# Patient Record
Sex: Female | Born: 1970 | Race: White | Hispanic: No | Marital: Married | Smoking: Never smoker
Health system: Southern US, Community
[De-identification: ages and names within clinical notes are randomized; demographics above are authoritative.]

---

## 2015-03-04 ENCOUNTER — Encounter (HOSPITAL_BASED_OUTPATIENT_CLINIC_OR_DEPARTMENT_OTHER): Payer: Self-pay | Admitting: Emergency Medicine

## 2015-03-04 ENCOUNTER — Emergency Department (HOSPITAL_BASED_OUTPATIENT_CLINIC_OR_DEPARTMENT_OTHER)
Admission: EM | Admit: 2015-03-04 | Discharge: 2015-03-04 | Disposition: A | Discharge status: Home / Self Care | Payer: Federal, State, Local not specified - PPO | Attending: Emergency Medicine | Admitting: Emergency Medicine

## 2015-03-04 ENCOUNTER — Emergency Department (HOSPITAL_BASED_OUTPATIENT_CLINIC_OR_DEPARTMENT_OTHER): Payer: Federal, State, Local not specified - PPO

## 2015-03-04 DIAGNOSIS — R002 Palpitations: Secondary | ICD-10-CM

## 2015-03-04 DIAGNOSIS — R55 Syncope and collapse: Secondary | ICD-10-CM | POA: Diagnosis not present

## 2015-03-04 DIAGNOSIS — Z79899 Other long term (current) drug therapy: Secondary | ICD-10-CM | POA: Diagnosis not present

## 2015-03-04 DIAGNOSIS — R079 Chest pain, unspecified: Secondary | ICD-10-CM | POA: Diagnosis present

## 2015-03-04 LAB — BASIC METABOLIC PANEL
ANION GAP: 11 (ref 5–15)
BUN: 16 mg/dL (ref 6–20)
CO2: 25 mmol/L (ref 22–32)
Calcium: 9.1 mg/dL (ref 8.9–10.3)
Chloride: 105 mmol/L (ref 101–111)
Creatinine, Ser: 0.85 mg/dL (ref 0.44–1.00)
GFR calc Af Amer: >60 mL/min (ref >60)
GFR calc non Af Amer: >60 mL/min (ref >60)
GLUCOSE: 96 mg/dL (ref 65–99)
POTASSIUM: 3.3 mmol/L — AB (ref 3.5–5.1)
Sodium: 141 mmol/L (ref 135–145)

## 2015-03-04 LAB — CBC
HEMATOCRIT: 40.9 % (ref 36.0–46.0)
HEMOGLOBIN: 14.5 g/dL (ref 12.0–15.0)
MCH: 33 pg (ref 26.0–34.0)
MCHC: 35.5 g/dL (ref 30.0–36.0)
MCV: 93 fL (ref 78.0–100.0)
Platelets: 153 10*3/uL (ref 150–400)
RBC: 4.4 MIL/uL (ref 3.87–5.11)
RDW: 12 % (ref 11.5–15.5)
WBC: 7.2 10*3/uL (ref 4.0–10.5)

## 2015-03-04 LAB — TROPONIN I: Troponin I: <0.03 ng/mL (ref <0.031)

## 2015-03-04 MED ORDER — SODIUM CHLORIDE 0.9 % IV BOLUS (SEPSIS): 1000.0000 mL | Freq: Once | INTRAVENOUS | Status: DC

## 2015-03-04 MED ORDER — SODIUM CHLORIDE 0.9 % IV BOLUS (SEPSIS)
1000.0000 mL | Freq: Once | INTRAVENOUS | Status: AC
  Administered 2015-03-04: 1000 mL via INTRAVENOUS

## 2015-03-04 NOTE — ED Provider Notes (Signed)
CSN: [id number]     Arrival date & time 03/04/15  1610 History   First MD Initiated Contact with Patient 03/04/15 1632     Chief Complaint  Patient presents with  . Chest Pain     (Consider location/radiation/quality/duration/timing/severity/associated sxs/prior Treatment) HPI   Blood pressure 148/79, pulse 99, temperature 98.3 F (36.8 C), temperature source Oral, resp. rate 18, height  (1.676 m), weight 142 lb (64.411 kg), last menstrual period 03/04/2015, SpO2 100 %.  Barbara Pearson is a 44 y.o. female complaining of fluttering sensation in the left side of the chest onset 1 month ago she has these episodes several times per week, she feels lightheaded as well. This isn't necessarily tied to exertion or standing. Patient denies history of DVT or PE, family history of early cardiac death, family history of ACS, tobacco use, diabetes, hypertension, hyperlipidemia, recent trips or mobilizations, Esther June use, calf pain or leg swelling. As per her husband she drinks approximately 6 ounces of water per day. She denies nausea, vomiting, diarrhea.   History reviewed. No pertinent past medical history. History reviewed. No pertinent past surgical history. History reviewed. No pertinent family history. Social History  Substance Use Topics  . Smoking status: Never Smoker   . Smokeless tobacco: None  . Alcohol Use: Yes     Comment: occassional   OB History    No data available     Review of Systems  10 systems reviewed and found to be negative, except as noted in the HPI.  Allergies  Codeine  Home Medications   Prior to Admission medications   Medication Sig Start Date End Date Taking? Authorizing Provider  fexofenadine (ALLEGRA) 30 MG tablet Take 30 mg by mouth 2 (two) times daily.   Yes Historical Provider, MD   BP 125/84 mmHg  Pulse 86  Temp(Src) 98.3 F (36.8 C) (Oral)  Resp 15  Ht  (1.676 m)  Wt 142 lb (64.411 kg)  BMI 22.93 kg/m2  SpO2 100%  LMP  03/04/2015 (LMP Unknown) Physical Exam  Constitutional: She appears well-developed and well-nourished.  HENT:  Head: Normocephalic.  Eyes: Conjunctivae are normal.  Neck: Normal range of motion.  Cardiovascular: Normal rate, regular rhythm and intact distal pulses.   Pulmonary/Chest: Effort normal.  Abdominal: Soft. There is no tenderness.  Neurological: She is alert.  No point tenderness to percussion of lumbar spinal processes.  No TTP or paraspinal muscular spasm. Strength is 5 out of 5 to bilateral lower extremities at hip and knee; extensor hallucis longus 5 out of 5. Ankle strength 5 out of 5, no clonus, neurovascularly intact. No saddle anaesthesia. Patellar reflexes are 2+ bilaterally.       Psychiatric: She has a normal mood and affect.  Nursing note and vitals reviewed.   ED Course  Procedures (including critical care time) Labs Review Labs Reviewed  BASIC METABOLIC PANEL - Abnormal; Notable for the following:    Potassium 3.3 (*)    All other components within normal limits  CBC  TROPONIN I    Imaging Review Dg Chest 2 View  03/04/2015   CLINICAL DATA:  Intermittent chest pain for 3 weeks.  EXAM: CHEST  2 VIEW  COMPARISON:  None.  FINDINGS: Normal cardiac and mediastinal contours. Monitoring leads overlie the patient. No consolidative pulmonary opacities. No pleural effusion or pneumothorax. Mid thoracic spine degenerative changes.  IMPRESSION: No active cardiopulmonary disease.   Electronically Signed   By: Annia Belt M.D.   On: 03/04/2015  16:59   I have personally reviewed and evaluated these images and lab results as part of my medical decision-making.   EKG Interpretation None      MDM   Final diagnoses:  Palpitations  Pre-syncope    Filed Vitals:   03/04/15 1648 03/04/15 1700 03/04/15 1727 03/04/15 1728  BP: 131/77 126/69 136/81 125/84  Pulse: 77 74 74 86  Temp:      TempSrc:      Resp: 16 13 18 15   Height:      Weight:      SpO2: 96% 99% 100%  100%    Medications  sodium chloride 0.9 % bolus 1,000 mL (not administered)  sodium chloride 0.9 % bolus 1,000 mL (1,000 mLs Intravenous New Bag/Given 03/04/15 1730)    Barbara Pearson is a pleasant 44 y.o. female presenting with chest fluttering and lightheaded sensation intermittently over the course of the last 4 weeks. Doesn't really describe it as pain. Patient has normal vital signs, her EKG is without arrhythmia or ischemia, chest x-ray and blood work are reassuring. This may be dehydration versus intermittent arrhythmia, I've advised her that she may need to see cardiology for Holter monitoring. Will check orthostatics and give a second bolus.  Vital signs are not consistent with orthostasis. Cardiology referral given and also encouraged her to her primary care Dr. Morrie Sheldon at no vomiting.  Evaluation does not show pathology that would require ongoing emergent intervention or inpatient treatment. Pt is hemodynamically stable and mentating appropriately. Discussed findings and plan with patient/guardian, who agrees with care plan. All questions answered. Return precautions discussed and outpatient follow up given.       Wynetta Emery, PA-C 03/04/15 1757  Geoffery Lyons, MD 03/04/15 2248

## 2015-03-04 NOTE — ED Notes (Signed)
Patient reports chest pain which began about 3 weeks ago. Reports worsening pain today. Reports the pain as constant currently but states this was intermittent in the past.

## 2015-03-04 NOTE — Discharge Instructions (Signed)
Please follow with your primary care doctor in the next 2 days for a check-up. They must obtain records for further management.   Do not hesitate to return to the Emergency Department for any new, worsening or concerning symptoms.    Palpitations A palpitation is the feeling that your heartbeat is irregular or is faster than normal. It may feel like your heart is fluttering or skipping a beat. Palpitations are usually not a serious problem. However, in some cases, you may need further medical evaluation. CAUSES  Palpitations can be caused by:  Smoking.  Caffeine or other stimulants, such as diet pills or energy drinks.  Alcohol.  Stress and anxiety.  Strenuous physical activity.  Fatigue.  Certain medicines.  Heart disease, especially if you have a history of irregular heart rhythms (arrhythmias), such as atrial fibrillation, atrial flutter, or supraventricular tachycardia.  An improperly working pacemaker or defibrillator. DIAGNOSIS  To find the cause of your palpitations, your health care provider will take your medical history and perform a physical exam. Your health care provider may also have you take a test called an ambulatory electrocardiogram (ECG). An ECG records your heartbeat patterns over a 24-hour period. You may also have other tests, such as:  Transthoracic echocardiogram (TTE). During echocardiography, sound waves are used to evaluate how blood flows through your heart.  Transesophageal echocardiogram (TEE).  Cardiac monitoring. This allows your health care provider to monitor your heart rate and rhythm in real time.  Holter monitor. This is a portable device that records your heartbeat and can help diagnose heart arrhythmias. It allows your health care provider to track your heart activity for several days, if needed.  Stress tests by exercise or by giving medicine that makes the heart beat faster. TREATMENT  Treatment of palpitations depends on the cause of  your symptoms and can vary greatly. Most cases of palpitations do not require any treatment other than time, relaxation, and monitoring your symptoms. Other causes, such as atrial fibrillation, atrial flutter, or supraventricular tachycardia, usually require further treatment. HOME CARE INSTRUCTIONS   Avoid:  Caffeinated coffee, tea, soft drinks, diet pills, and energy drinks.  Chocolate.  Alcohol.  Stop smoking if you smoke.  Reduce your stress and anxiety. Things that can help you relax include:  A method of controlling things in your body, such as your heartbeats, with your mind (biofeedback).  Yoga.  Meditation.  Physical activity such as swimming, jogging, or walking.  Get plenty of rest and sleep. SEEK MEDICAL CARE IF:   You continue to have a fast or irregular heartbeat beyond 24 hours.  Your palpitations occur more often. SEEK IMMEDIATE MEDICAL CARE IF:  You have chest pain or shortness of breath.  You have a severe headache.  You feel dizzy or you faint. MAKE SURE YOU:  Understand these instructions.  Will watch your condition.  Will get help right away if you are not doing well or get worse. Document Released: 06/27/2000 Document Revised: 07/05/2013 Document Reviewed: 08/29/2011 [facility] Patient Information 2015 [place], [place]. This information is not intended to replace advice given to you by your health care provider. Make sure you discuss any questions you have with your health care provider.  Holter Monitoring A Holter monitor is a small device with electrodes (small sticky patches) that attach to your chest. It records the electrical activity of your heart and is worn continuously for 24-48 hours.  A HOLTER MONITOR IS USED TO  Detect heart problems such as:  Heart arrhythmia.  Is an abnormal or irregular heartbeat. With some heart arrhythmias, you may not feel or know that you have an irregular heart rhythm.  Palpitations, such as feeling your  heart racing or fluttering. It is possible to have heart palpitations and not have a heart arrhythmia.  A heart rhythm that is too slow or too fast.  If you have problems fainting, near fainting or feeling light-headed, a Holter monitor may be worn to see if your heart is the cause. HOLTER MONITOR PREPARATION   Electrodes will be attached to the skin on your chest.  If you have hair on your chest, small areas may have to be shaved. This is done to help the patches stick better and make the recording more accurate.  The electrodes are attached by wires to the Holter monitor. The Holter monitor clips to your clothing. You will wear the monitor at all times, even while exercising and sleeping. HOME CARE INSTRUCTIONS   Wear your monitor at all times.  The wires and the monitor must stay dry. Do not get the monitor wet.  Do not bathe, swim or use a hot tub with it on.  You may do a "sponge" bath while you have the monitor on.  Keep your skin clean, do not put body lotion or moisturizer on your chest.  It's possible that your skin under the electrodes could become irritated. To keep this from happening, you may put the electrodes in slightly different places on your chest.  Your caregiver will also ask you to keep a diary of your activities, such as walking or doing chores. Be sure to note what you are doing if you experience heart symptoms such as palpitations. This will help your caregiver determine what might be contributing to your symptoms. The information stored in your monitor will be reviewed by your caregiver alongside your diary entries.  Make sure the monitor is safely clipped to your clothing or in a location close to your body that your caregiver recommends.  The monitor and electrodes are removed when the test is over. Return the monitor as directed.  Be sure to follow up with your caregiver and discuss your Holter monitor results. SEEK IMMEDIATE MEDICAL CARE IF:  You faint  or feel lightheaded.  You have trouble breathing.  You get pain in your chest, upper arm or jaw.  You feel sick to your stomach and your skin is pale, cool, or damp.  You think something is wrong with the way your heart is beating. MAKE SURE YOU:   Understand these instructions.  Will watch your condition.  Will get help right away if you are not doing well or get worse. Document Released: 03/28/2004 Document Revised: 09/22/2011 Document Reviewed: 08/10/2008 [facility] Patient Information 2015 [place], [place]. This information is not intended to replace advice given to you by your health care provider. Make sure you discuss any questions you have with your health care provider.

## 2015-03-04 NOTE — ED Notes (Signed)
Patient transported to X-ray 

## 2015-04-02 ENCOUNTER — Ambulatory Visit: Payer: Federal, State, Local not specified - PPO | Admitting: Cardiovascular Disease

## 2016-01-03 ENCOUNTER — Ambulatory Visit (INDEPENDENT_AMBULATORY_CARE_PROVIDER_SITE_OTHER): Payer: Federal, State, Local not specified - PPO | Admitting: Pediatrics

## 2016-01-03 ENCOUNTER — Encounter: Payer: Self-pay | Admitting: Pediatrics

## 2016-01-03 VITALS — BP 114/70 | HR 76 | Temp 98.1°F | Resp 16 | Ht 66.5 in | Wt 151.2 lb

## 2016-01-03 DIAGNOSIS — J301 Allergic rhinitis due to pollen: Secondary | ICD-10-CM

## 2016-01-03 DIAGNOSIS — H1010 Acute atopic conjunctivitis, unspecified eye: Secondary | ICD-10-CM

## 2016-01-03 DIAGNOSIS — H1045 Other chronic allergic conjunctivitis: Secondary | ICD-10-CM

## 2016-01-03 MED ORDER — FLUTICASONE PROPIONATE 50 MCG/ACT NA SUSP: 2.0000 | Freq: Every day | NASAL | Status: AC | PRN

## 2016-01-03 NOTE — Patient Instructions (Signed)
Environmental control of dust Fexofenadine 180 mg once a day for runny nose or itchy eyes Fluticasone 2 sprays per nostril once a day if needed for stuffy nose Zaditor 0.025% one drop 3 times a day in each eye to prevent allergies Opcon-A one drop 3 times a day if needed for itchy eyes

## 2016-01-03 NOTE — Progress Notes (Signed)
[address] [place] [address] Dept: [phone]  New Patient Note  Patient ID: Barbara HoughJulie Butkiewicz, female    DOB: July 11, 1971  Age: 45 y.o. MRN: [id number] Date of Office Visit: 01/03/2016 Referring provider: No referring provider defined for this encounter.    Chief Complaint: Allergies  HPI Barbara HoughJulie Crombie presents for an allergy evaluation. She has had a runny nose, stuffy nose and itchy eyes for several years. She has aggravation of her symptoms on exposure to dust , cigarette smoke and the spring and fall of the year. Her symptoms are perennial. She has had itchy eyes and has chronic dry eyes. She has a history of eczema. She has never had asthmatic symptoms. She has not had chronic urticaria.  Review of Systems  Constitutional: Negative.   HENT:       Perennial nasal congestion for several years. Worse in the springtime  Respiratory: Negative.   Cardiovascular:       Rapid heartbeat last year. She wore a monitor of her heart for a month. There were no cardiac rhythm problems. It was felt to be due to anxiety. Her  symptoms have resolved  Gastrointestinal: Negative.   Genitourinary: Negative.   Musculoskeletal: Negative.   Skin:       History of eczema  Neurological: Negative.   Endo/Heme/Allergies:       No diabetes or thyroid disease  Psychiatric/Behavioral: Negative.     Outpatient Encounter Prescriptions as of 01/03/2016  Medication Sig  . fluticasone (FLONASE) 50 MCG/ACT nasal spray Place 2 sprays into both nostrils daily as needed for allergies or rhinitis.  . [DISCONTINUED] fluticasone (FLONASE) 50 MCG/ACT nasal spray   . fexofenadine (ALLEGRA) 180 MG tablet Take by mouth.  . [DISCONTINUED] fexofenadine (ALLEGRA) 30 MG tablet Take 30 mg by mouth 2 (two) times daily.   No facility-administered encounter medications on file as of 01/03/2016.     Drug Allergies:  Allergies  Allergen Reactions  . Codeine Shortness Of Breath  . Sulfamethoxazole-Trimethoprim Rash     Family History: Cecia's family history includes Allergic rhinitis in her mother and sister; Asthma in her sister; Eczema in her father; Food Allergy in her sister; Sinusitis in her sister. There is no history of Urticaria, Immunodeficiency, Atopy, or Angioedema..  Social and environmental. There are no pets in the home. She is not exposed to cigarette smoking. She has never smoked cigarettes. She works indoors. Her symptoms are not worse there  Physical Exam: BP 114/70 mmHg  Pulse 76  Temp(Src) 98.1 F (36.7 C) (Oral)  Resp 16  Ht 5' 6.5" (1.689 m)  Wt 151 lb 3.2 oz (68.584 kg)  BMI 24.04 kg/m2   Physical Exam  Constitutional: She is oriented to person, place, and time. She appears well-developed and well-nourished.  HENT:  Eyes normal. Ears normal. Nose moderate swelling of nasal  turbinates with clear nasal discharge. Pharynx normal.  Neck: Neck supple. No thyromegaly present.  Cardiovascular:  S1 and S2 normal no murmurs  Pulmonary/Chest:  Clear to percussion and auscultation  Abdominal: Soft. There is no tenderness (no hepatosplenomegaly).  Lymphadenopathy:    She has no cervical adenopathy.  Neurological: She is alert and oriented to person, place, and time.  Skin:  Clear  Psychiatric: She has a normal mood and affect. Her behavior is normal. Judgment and thought content normal.  Vitals reviewed.   Diagnostics: Allergy skin tests were positive to grass pollens, ragweed, weeds, tree pollens. Slight reactions noted to molds   Assessment Assessment and  Plan: 1. Allergic rhinitis due to pollen   2. Seasonal allergic conjunctivitis     Meds ordered this encounter  Medications  . fluticasone (FLONASE) 50 MCG/ACT nasal spray    Sig: Place 2 sprays into both nostrils daily as needed for allergies or rhinitis.    Dispense:  16 g    Refill:  5    Patient will call    Patient Instructions  Environmental control of dust Fexofenadine 180 mg once a day for runny  nose or itchy eyes Fluticasone 2 sprays per nostril once a day if needed for stuffy nose Zaditor 0.025% one drop 3 times a day in each eye to prevent allergies Opcon-A one drop 3 times a day if needed for itchy eyes    Return if symptoms worsen or fail to improve.   Thank you for the opportunity to care for this patient.  Please do not hesitate to contact me with questions.  Tonette BihariJ. A. Nira Visscher, M.D.  [facility] [address] [place], [place] [address] [phone]

## 2020-06-19 ENCOUNTER — Emergency Department (HOSPITAL_BASED_OUTPATIENT_CLINIC_OR_DEPARTMENT_OTHER): Payer: Federal, State, Local not specified - PPO

## 2020-06-19 ENCOUNTER — Encounter (HOSPITAL_BASED_OUTPATIENT_CLINIC_OR_DEPARTMENT_OTHER): Payer: Self-pay | Admitting: *Deleted

## 2020-06-19 ENCOUNTER — Other Ambulatory Visit: Payer: Self-pay

## 2020-06-19 ENCOUNTER — Emergency Department (HOSPITAL_BASED_OUTPATIENT_CLINIC_OR_DEPARTMENT_OTHER)
Admission: EM | Admit: 2020-06-19 | Discharge: 2020-06-19 | Disposition: A | Discharge status: Home / Self Care | Payer: Federal, State, Local not specified - PPO | Attending: Emergency Medicine | Admitting: Emergency Medicine

## 2020-06-19 DIAGNOSIS — M79671 Pain in right foot: Secondary | ICD-10-CM | POA: Insufficient documentation

## 2020-06-19 DIAGNOSIS — R0789 Other chest pain: Secondary | ICD-10-CM | POA: Insufficient documentation

## 2020-06-19 DIAGNOSIS — Y92410 Unspecified street and highway as the place of occurrence of the external cause: Secondary | ICD-10-CM | POA: Diagnosis not present

## 2020-06-19 DIAGNOSIS — R079 Chest pain, unspecified: Secondary | ICD-10-CM | POA: Diagnosis present

## 2020-06-19 MED ORDER — METHOCARBAMOL 500 MG PO TABS: 500.0000 mg | ORAL_TABLET | Freq: Two times a day (BID) | ORAL | 0 refills | Status: AC

## 2020-06-19 MED ORDER — ACETAMINOPHEN 500 MG PO TABS
1000.0000 mg | ORAL_TABLET | Freq: Once | ORAL | Status: AC
  Administered 2020-06-19: 1000 mg via ORAL
  Filled 2020-06-19: qty 2

## 2020-06-19 MED ORDER — IBUPROFEN 400 MG PO TABS
600.0000 mg | ORAL_TABLET | Freq: Once | ORAL | Status: AC
  Administered 2020-06-19: 600 mg via ORAL
  Filled 2020-06-19: qty 1
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
## 2020-06-19 NOTE — ED Notes (Signed)
Pain control pt teaching done re: using RICE technique, provided ice pack to client and instructed on how to fill and empty and also how long ice pack should be used on injury sites, VSS, ED PA informed of HR now less than 100/min
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
## 2020-06-19 NOTE — Discharge Instructions (Signed)
The pain you are experiencing is likely due to muscle strain, you may take Ibuprofen 600 mg every 6 hours and Robaxin as needed for pain management. Do not combine with any pain reliever other than tylenol 1000 mg every 6 hours.  You may also use ice and heat, and over-the-counter remedies such as Biofreeze gel or salon pas lidocaine patches. The muscle soreness should improve over the next week. Follow up with your family doctor in the next week for a recheck if you are still having symptoms. Return to ED if pain is worsening, you develop weakness or numbness of extremities, or new or concerning symptoms develop.

## 2022-04-30 ENCOUNTER — Other Ambulatory Visit: Payer: Self-pay

## 2022-04-30 ENCOUNTER — Emergency Department (HOSPITAL_BASED_OUTPATIENT_CLINIC_OR_DEPARTMENT_OTHER)
Admission: EM | Admit: 2022-04-30 | Discharge: 2022-04-30 | Disposition: A | Discharge status: Home / Self Care | Payer: Federal, State, Local not specified - PPO | Attending: Emergency Medicine | Admitting: Emergency Medicine

## 2022-04-30 ENCOUNTER — Encounter (HOSPITAL_BASED_OUTPATIENT_CLINIC_OR_DEPARTMENT_OTHER): Payer: Self-pay | Admitting: Emergency Medicine

## 2022-04-30 ENCOUNTER — Emergency Department (HOSPITAL_BASED_OUTPATIENT_CLINIC_OR_DEPARTMENT_OTHER): Payer: Federal, State, Local not specified - PPO

## 2022-04-30 DIAGNOSIS — R0602 Shortness of breath: Secondary | ICD-10-CM | POA: Diagnosis not present

## 2022-04-30 DIAGNOSIS — R0789 Other chest pain: Secondary | ICD-10-CM | POA: Insufficient documentation

## 2022-04-30 DIAGNOSIS — R000 Tachycardia, unspecified: Secondary | ICD-10-CM | POA: Diagnosis not present

## 2022-04-30 DIAGNOSIS — R42 Dizziness and giddiness: Secondary | ICD-10-CM | POA: Diagnosis not present

## 2022-04-30 DIAGNOSIS — R079 Chest pain, unspecified: Secondary | ICD-10-CM | POA: Diagnosis present

## 2022-04-30 LAB — TROPONIN I (HIGH SENSITIVITY)
Troponin I (High Sensitivity): <2 ng/L (ref <18)
Troponin I (High Sensitivity): <2 ng/L (ref <18)

## 2022-04-30 LAB — BASIC METABOLIC PANEL
Anion gap: 6 (ref 5–15)
BUN: 10 mg/dL (ref 6–20)
CO2: 27 mmol/L (ref 22–32)
Calcium: 8.6 mg/dL — ABNORMAL LOW (ref 8.9–10.3)
Chloride: 106 mmol/L (ref 98–111)
Creatinine, Ser: 0.71 mg/dL (ref 0.44–1.00)
GFR, Estimated: >60 mL/min (ref >60)
Glucose, Bld: 108 mg/dL — ABNORMAL HIGH (ref 70–99)
Potassium: 3.4 mmol/L — ABNORMAL LOW (ref 3.5–5.1)
Sodium: 139 mmol/L (ref 135–145)

## 2022-04-30 LAB — CBC
HCT: 43.7 % (ref 36.0–46.0)
Hemoglobin: 14.5 g/dL (ref 12.0–15.0)
MCH: 31.3 pg (ref 26.0–34.0)
MCHC: 33.2 g/dL (ref 30.0–36.0)
MCV: 94.4 fL (ref 80.0–100.0)
Platelets: 169 10*3/uL (ref 150–400)
RBC: 4.63 MIL/uL (ref 3.87–5.11)
RDW: 12.7 % (ref 11.5–15.5)
WBC: 5.7 10*3/uL (ref 4.0–10.5)
nRBC: 0 % (ref 0.0–0.2)

## 2022-04-30 LAB — D-DIMER, QUANTITATIVE: D-Dimer, Quant: <0.27 ug/mL-FEU (ref 0.00–0.50)

## 2022-04-30 LAB — PREGNANCY, URINE: Preg Test, Ur: NEGATIVE

## 2022-04-30 MED ORDER — HYDROXYZINE HCL 25 MG PO TABS
25.0000 mg | ORAL_TABLET | Freq: Once | ORAL | Status: AC
  Administered 2022-04-30: 25 mg via ORAL
  Filled 2022-04-30: qty 1

## 2022-04-30 MED ORDER — HYDROXYZINE HCL 25 MG PO TABS: 25.0000 mg | ORAL_TABLET | Freq: Four times a day (QID) | ORAL | 0 refills | Status: AC

## 2022-04-30 MED ORDER — KETOROLAC TROMETHAMINE 15 MG/ML IJ SOLN
15.0000 mg | Freq: Once | INTRAMUSCULAR | Status: AC
  Administered 2022-04-30: 15 mg via INTRAVENOUS
  Filled 2022-04-30: qty 1

## 2022-04-30 NOTE — Discharge Instructions (Addendum)
Your work-up today was quite reassuring.  I recommend follow-up with your primary care provider given that there may be--as we discussed--an element of anxiety.  However I do want you to closely monitor your chest pain and return to the ER for any vomiting, fevers, difficulty breathing, coughing up blood or any other new or concerning symptoms.  I have written you prescription for some hydroxyzine which you can take as prescribed this may help some with anxiety however it is not a first-line anxiety medication which would need to be started by your primary care provider.

## 2022-04-30 NOTE — ED Triage Notes (Signed)
Pt states she was sitting at her desk ~ 1 hour ago and started to feel chest pain, shob, and lightheaded. Sx are still present. Chest pain is central, intermittent, and with no radiation. Bending forward worsens pain.

## 2022-04-30 NOTE — ED Provider Notes (Signed)
[facility] EMERGENCY DEPARTMENT Provider Note   CSN: [id number] Arrival date & time: 04/30/22  1355     History Chief Complaint  Patient presents with   Chest Pain    Barbara Pearson is a 51 y.o. female.   Chest Pain Patient is a 51 year old female with no past medical history presents emergency room complaints of chest pain began around 1230 soon after lunch.  She states that she was sitting at her desk when her symptoms came on.  She states she started to feel a pain between her breasts and describes some chest tightness that made her feel short of breath.  She states she did not have any difficulty breathing but the sensation that her chest was tight.  She states she also felt somewhat lightheaded.  She has a history of palpitations that she was seen by cardiology for and ultimately diagnosed with anxiety.  She states that she did not feel any palpitations today however.  No recent surgeries, hospitalization, long travel, hemoptysis, estrogen containing OCP, cancer history.  No unilateral leg swelling.  No history of PE or VTE.   Non-exertional pain and non-pleuritic.      Home Medications Prior to Admission medications   Medication Sig Start Date End Date Taking? Authorizing Provider  hydrOXYzine (ATARAX) 25 MG tablet Take 1 tablet (25 mg total) by mouth every 6 (six) hours for 14 days. 04/30/22 05/14/22 Yes Masahiro Iglesia S, PA  fexofenadine (ALLEGRA) 180 MG tablet Take by mouth.    [provider]  fluticasone (FLONASE) 50 MCG/ACT nasal spray Place 2 sprays into both nostrils daily as needed for allergies or rhinitis. 01/03/16   Fletcher Anon, MD  methocarbamol (ROBAXIN) 500 MG tablet Take 1 tablet (500 mg total) by mouth 2 (two) times daily. 06/19/20   Dartha Lodge, PA-C      Allergies    Codeine and Sulfamethoxazole-trimethoprim    Review of Systems   Review of Systems  Cardiovascular:  Positive for chest pain.    Physical Exam Updated Vital  Signs BP 125/70   Pulse 80   Temp 97.6 F (36.4 C) (Oral)   Resp 16   Ht 5\' 6"  (1.676 m)   Wt 71.7 kg   SpO2 99%   BMI 25.50 kg/m  Physical Exam Vitals and nursing note reviewed.  Constitutional:      General: She is not in acute distress. HENT:     Head: Normocephalic and atraumatic.     Nose: Nose normal.     Mouth/Throat:     Mouth: Mucous membranes are moist.  Eyes:     General: No scleral icterus. Cardiovascular:     Rate and Rhythm: Regular rhythm. Tachycardia present.     Pulses: Normal pulses.     Heart sounds: Normal heart sounds.  Pulmonary:     Effort: Pulmonary effort is normal. No respiratory distress.     Breath sounds: No wheezing.  Abdominal:     Palpations: Abdomen is soft.     Tenderness: There is no abdominal tenderness.  Musculoskeletal:     Cervical back: Normal range of motion.     Right lower leg: No edema.     Left lower leg: No edema.  Skin:    General: Skin is warm and dry.     Capillary Refill: Capillary refill takes less than 2 seconds.  Neurological:     Mental Status: She is alert. Mental status is at baseline.  Psychiatric:  Mood and Affect: Mood normal.        Behavior: Behavior normal.     ED Results / Procedures / Treatments   Labs (all labs ordered are listed, but only abnormal results are displayed) Labs Reviewed  BASIC METABOLIC PANEL - Abnormal; Notable for the following components:      Result Value   Potassium 3.4 (*)    Glucose, Bld 108 (*)    Calcium 8.6 (*)    All other components within normal limits  CBC  PREGNANCY, URINE  D-DIMER, QUANTITATIVE  TROPONIN I (HIGH SENSITIVITY)  TROPONIN I (HIGH SENSITIVITY)    EKG EKG Interpretation  Date/Time:  Wednesday April 30 2022 14:03:53 EDT Ventricular Rate:  99 PR Interval:  124 QRS Duration: 80 QT Interval:  344 QTC Calculation: 441 R Axis:   65 Text Interpretation: Normal sinus rhythm Low voltage QRS Nonspecific ST abnormality Abnormal ECG When  compared with ECG of 19-Jun-2020 20:48, PREVIOUS ECG IS PRESENT Similar to 2021 tracing Confirmed by Nanda Quinton [id number]) on 04/30/2022 2:18:38 PM  Radiology DG Chest 2 View  Result Date: 04/30/2022 CLINICAL DATA:  Chest pain. EXAM: CHEST - 2 VIEW COMPARISON:  June 19, 2020. FINDINGS: The heart size and mediastinal contours are within normal limits. Both lungs are clear. The visualized skeletal structures are unremarkable. IMPRESSION: No active cardiopulmonary disease. Electronically Signed   By: Marijo Conception M.D.   On: 04/30/2022 14:19    Procedures Procedures    Medications Ordered in ED Medications  ketorolac (TORADOL) 15 MG/ML injection 15 mg (15 mg Intravenous Given 04/30/22 1536)  hydrOXYzine (ATARAX) tablet 25 mg (25 mg Oral Given 04/30/22 1536)    ED Course/ Medical Decision Making/ A&P Clinical Course as of 04/30/22 2130  Wed Apr 30, 2022  1617 12:30 [WF]    Clinical Course User Index [WF] Tedd Sias, [place]                           Medical Decision Making Amount and/or Complexity of Data Reviewed Labs: ordered. Radiology: ordered.  Risk Prescription drug management.   This patient presents to the ED for concern of CP, LH, this involves a number of treatment options, and is a complaint that carries with it a high risk of complications and morbidity. A differential diagnosis was considered for the patient's symptoms which is discussed below:   The emergent causes of chest pain include: Acute coronary syndrome, tamponade, pericarditis/myocarditis, aortic dissection, pulmonary embolism, tension pneumothorax, pneumonia, and esophageal rupture.    I do not believe the patient has an emergent cause of chest pain, other urgent/non-acute considerations include, but are not limited to: chronic angina, aortic stenosis, cardiomyopathy, mitral valve prolapse, pulmonary hypertension, aortic insufficiency, right ventricular hypertrophy, pleuritis, bronchitis,  pneumothorax, tumor, gastroesophageal reflux disease (GERD), esophageal spasm, Mallory-Weiss syndrome, peptic ulcer disease, pancreatitis, functional gastrointestinal pain, cervical or thoracic disk disease or arthritis, shoulder arthritis, costochondritis, subacromial bursitis, anxiety or panic attack, herpes zoster, breast disorders, chest wall tumors, thoracic outlet syndrome, mediastinitis.    Co morbidities: Discussed in HPI   Brief History:  Patient is a 51 year old female with no past medical history presents emergency room complaints of chest pain began around 1230 soon after lunch.  She states that she was sitting at her desk when her symptoms came on.  She states she started to feel a pain between her breasts and describes some chest tightness that made her feel short of breath.  She states she did not have any difficulty breathing but the sensation that her chest was tight.  She states she also felt somewhat lightheaded.  She has a history of palpitations that she was seen by cardiology for and ultimately diagnosed with anxiety.  She states that she did not feel any palpitations today however.  No recent surgeries, hospitalization, long travel, hemoptysis, estrogen containing OCP, cancer history.  No unilateral leg swelling.  No history of PE or VTE.   Non-exertional pain and non-pleuritic.     EMR reviewed including pt PMHx, past surgical history and past visits to ER.   See HPI for more details   Lab Tests:   I ordered and independently interpreted labs. Labs notable for dimer undetectably low, troponin x2 within normal limits. U preg neg.   Metabolic panel without any acute abnormality specifically kidney function within normal limits and no significant electrolyte abnormalities. CBC without leukocytosis or significant anemia.    Imaging Studies:  NAD. I personally reviewed all imaging studies and no acute abnormality found. I agree with radiology  interpretation.    Cardiac Monitoring:  The patient was maintained on a cardiac monitor.  I personally viewed and interpreted the cardiac monitored which showed an underlying rhythm of: Sinus tachycardia -> NSR.  EKG non-ischemic   Medicines ordered:  I ordered medication including hydroxyzine and ketorolac for pain/anxiety Reevaluation of the patient after these medicines showed that the patient improved I have reviewed the patients home medicines and have made adjustments as needed   Critical Interventions:     Consults/Attending Physician      Reevaluation:  After the interventions noted above I re-evaluated patient and found that they have :improved   Social Determinants of Health:      Problem List / ED Course:  Atypical CP. Doubt PE and dimer neg. Doubt ACS - 2 neg trops. Doubt dissection, no neuro sx and symmetric pulses. Workup reassuring. Consider pericarditis vs msk vs anxiety. Return precautions.    Dispostion:  After consideration of the diagnostic results and the patients response to treatment, I feel that the patent would benefit from close follow. Strict return precautions discussed.   Final Clinical Impression(s) / ED Diagnoses Final diagnoses:  Atypical chest pain    Rx / DC Orders ED Discharge Orders          Ordered    hydrOXYzine (ATARAX) 25 MG tablet  Every 6 hours        04/30/22 1716              Gailen Shelter, [place] 04/30/22 2233    Charlynne Pander, MD 04/30/22 2249

## 2022-05-03 IMAGING — CR DG FOOT COMPLETE 3+V*R*
3 series · 3 of 3 positions shown · non-contrast
Comparison: None.

CLINICAL DATA: Pain

EXAM:
RIGHT FOOT COMPLETE - 3+ VIEW

[t foot ap right]
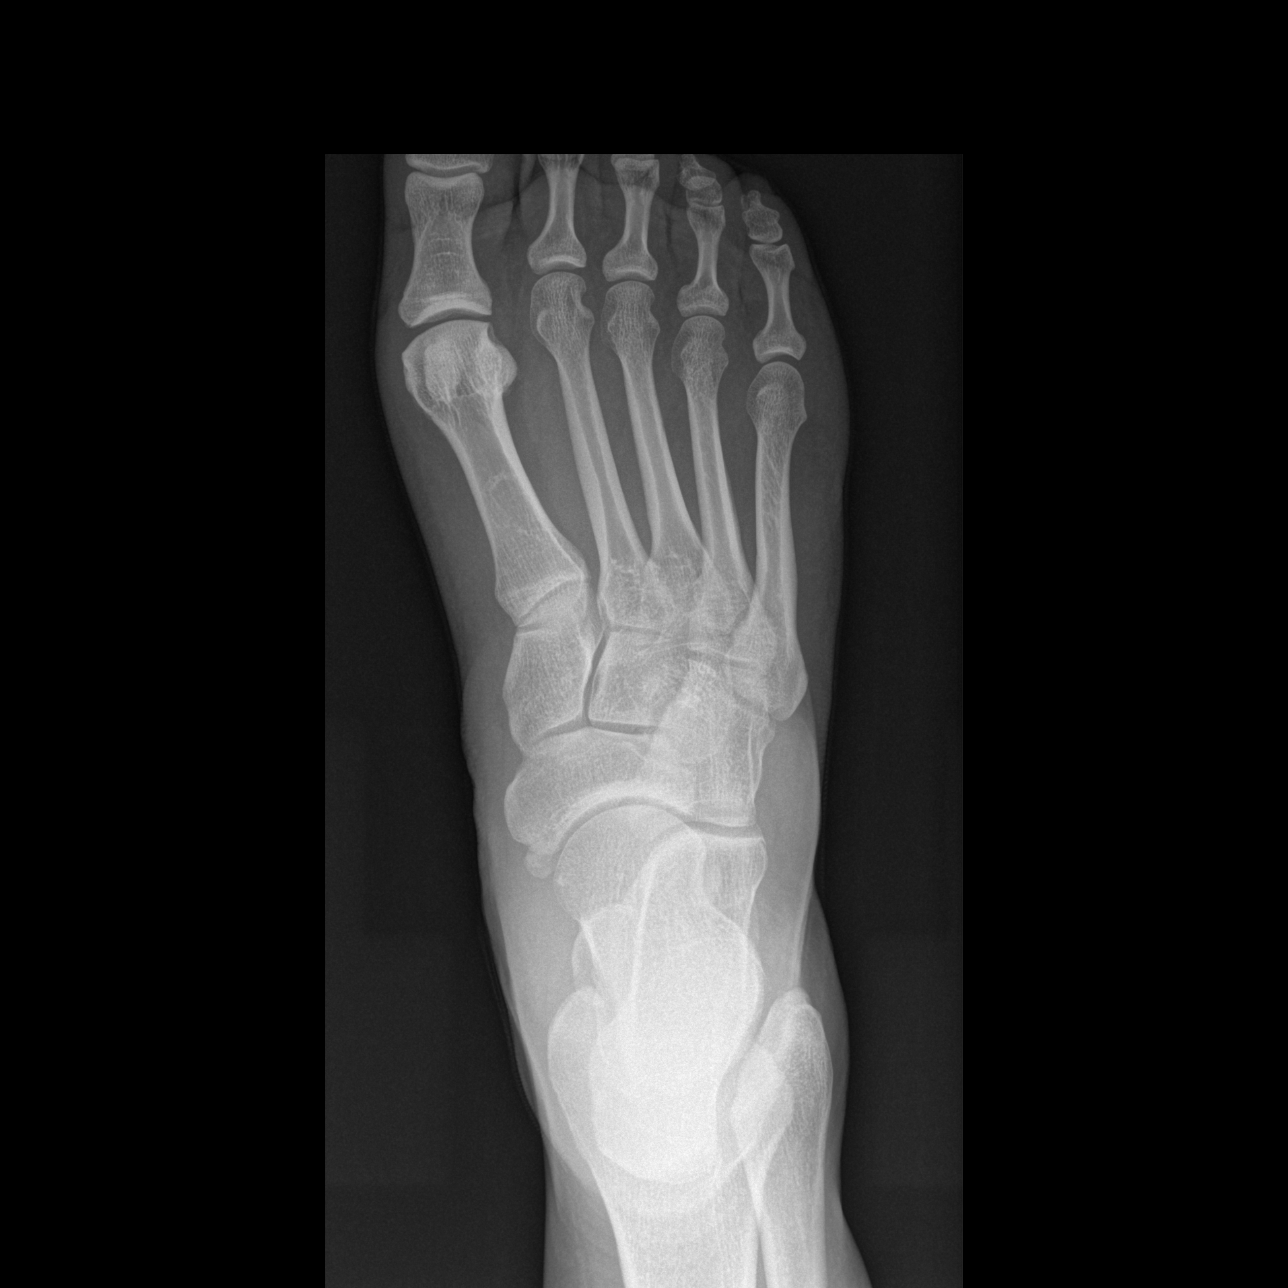

[t foot oblique right]
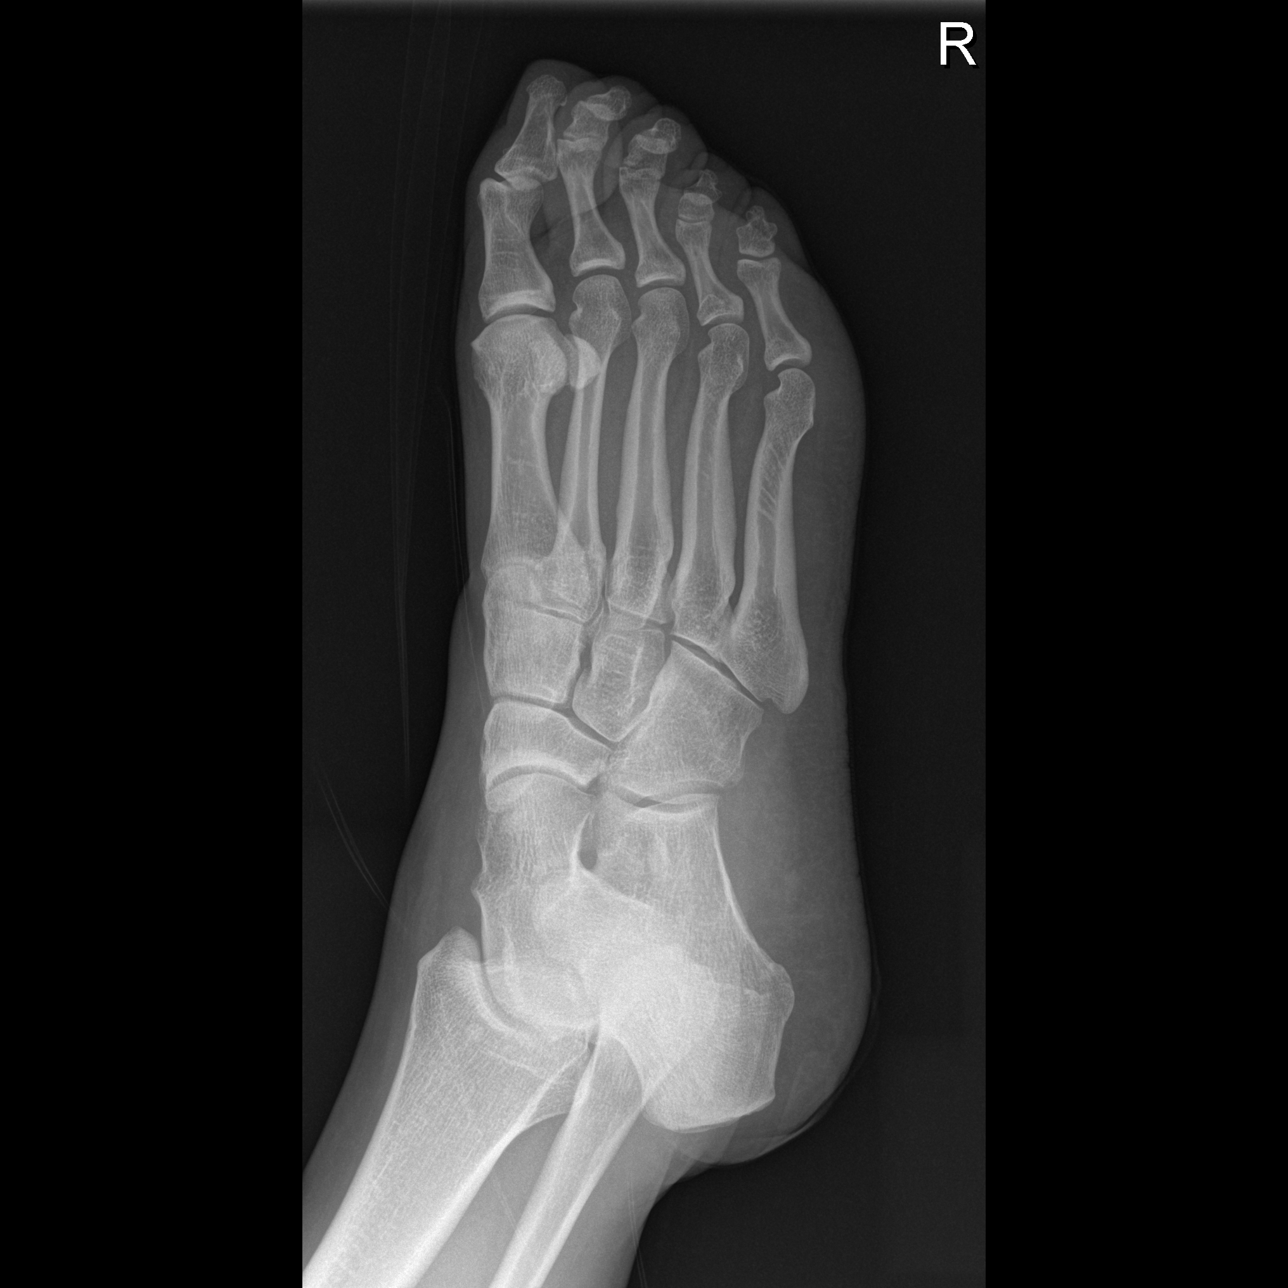

[t foot lat right]
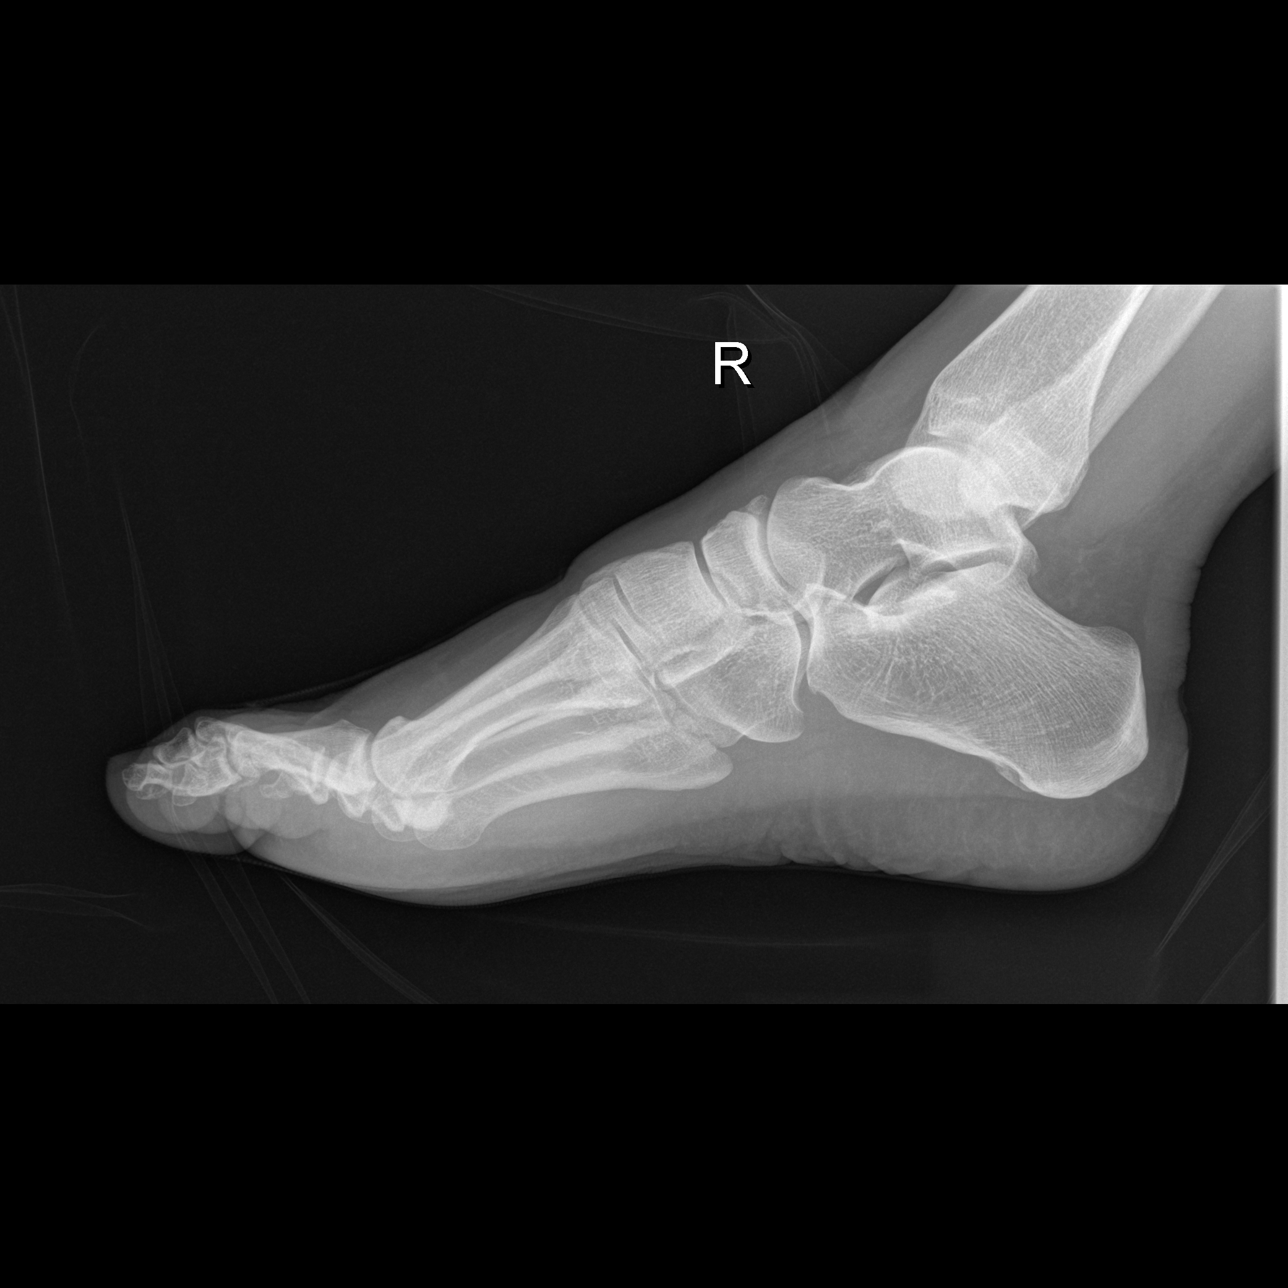

[3 of 3 positions shown; findings below may reference images not displayed]

FINDINGS: There is no evidence of fracture or dislocation. There is no
evidence of arthropathy or other focal bone abnormality. Soft
tissues are unremarkable.
IMPRESSION: Negative.
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
## 2022-09-17 ENCOUNTER — Other Ambulatory Visit: Payer: Self-pay
